# Patient Record
Sex: Male | Born: 1990 | Race: White | Hispanic: No | Smoking: Current every day smoker
Health system: Southern US, Community
[De-identification: ages and names within clinical notes are randomized; demographics above are authoritative.]

---
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
## 2012-01-03 ENCOUNTER — Emergency Department: Payer: Self-pay | Admitting: Emergency Medicine

## 2012-08-21 ENCOUNTER — Emergency Department: Payer: Self-pay | Admitting: Emergency Medicine

## 2014-11-10 ENCOUNTER — Emergency Department: Payer: Self-pay | Admitting: Emergency Medicine

## 2017-06-13 ENCOUNTER — Emergency Department
Admission: EM | Admit: 2017-06-13 | Discharge: 2017-06-13 | Disposition: A | Discharge status: Home / Self Care | Payer: BLUE CROSS/BLUE SHIELD | Attending: Emergency Medicine | Admitting: Emergency Medicine

## 2017-06-13 ENCOUNTER — Encounter: Payer: Self-pay | Admitting: Emergency Medicine

## 2017-06-13 DIAGNOSIS — R112 Nausea with vomiting, unspecified: Secondary | ICD-10-CM | POA: Diagnosis not present

## 2017-06-13 DIAGNOSIS — R42 Dizziness and giddiness: Secondary | ICD-10-CM | POA: Diagnosis present

## 2017-06-13 DIAGNOSIS — R1110 Vomiting, unspecified: Secondary | ICD-10-CM | POA: Diagnosis not present

## 2017-06-13 DIAGNOSIS — F17200 Nicotine dependence, unspecified, uncomplicated: Secondary | ICD-10-CM | POA: Diagnosis not present

## 2017-06-13 LAB — COMPREHENSIVE METABOLIC PANEL
ALBUMIN: 4.3 g/dL (ref 3.5–5.0)
ALK PHOS: 80 U/L (ref 38–126)
ALT: 36 U/L (ref 17–63)
ANION GAP: 7 (ref 5–15)
AST: 32 U/L (ref 15–41)
BUN: 14 mg/dL (ref 6–20)
CALCIUM: 9.3 mg/dL (ref 8.9–10.3)
CO2: 25 mmol/L (ref 22–32)
Chloride: 108 mmol/L (ref 101–111)
Creatinine, Ser: 0.9 mg/dL (ref 0.61–1.24)
GFR calc Af Amer: >60 mL/min (ref >60)
GFR calc non Af Amer: >60 mL/min (ref >60)
GLUCOSE: 112 mg/dL — AB (ref 65–99)
POTASSIUM: 3.7 mmol/L (ref 3.5–5.1)
SODIUM: 140 mmol/L (ref 135–145)
Total Bilirubin: 1 mg/dL (ref 0.3–1.2)
Total Protein: 7.3 g/dL (ref 6.5–8.1)

## 2017-06-13 LAB — CBC
HEMATOCRIT: 45.9 % (ref 40.0–52.0)
HEMOGLOBIN: 15.5 g/dL (ref 13.0–18.0)
MCH: 28.8 pg (ref 26.0–34.0)
MCHC: 33.8 g/dL (ref 32.0–36.0)
MCV: 85.1 fL (ref 80.0–100.0)
Platelets: 241 10*3/uL (ref 150–440)
RBC: 5.39 MIL/uL (ref 4.40–5.90)
RDW: 13.5 % (ref 11.5–14.5)
WBC: 9.8 10*3/uL (ref 3.8–10.6)

## 2017-06-13 LAB — URINALYSIS, COMPLETE (UACMP) WITH MICROSCOPIC
BACTERIA UA: NONE SEEN
Bilirubin Urine: NEGATIVE
Glucose, UA: NEGATIVE mg/dL
Hgb urine dipstick: NEGATIVE
Ketones, ur: 5 mg/dL — AB
Leukocytes, UA: NEGATIVE
Nitrite: NEGATIVE
PROTEIN: 30 mg/dL — AB
Specific Gravity, Urine: 1.04 — ABNORMAL HIGH (ref 1.005–1.030)
pH: 5 (ref 5.0–8.0)

## 2017-06-13 LAB — LIPASE, BLOOD: Lipase: 26 U/L (ref 11–51)

## 2017-06-13 MED ORDER — SODIUM CHLORIDE 0.9 % IV BOLUS (SEPSIS)
1000.0000 mL | Freq: Once | INTRAVENOUS | Status: AC
  Administered 2017-06-13: 1000 mL via INTRAVENOUS

## 2017-06-13 MED ORDER — ONDANSETRON HCL 4 MG/2ML IJ SOLN
4.0000 mg | Freq: Once | INTRAMUSCULAR | Status: AC
  Administered 2017-06-13: 4 mg via INTRAVENOUS
  Filled 2017-06-13: qty 2

## 2017-06-13 MED ORDER — ONDANSETRON HCL 4 MG PO TABS: 4.0000 mg | ORAL_TABLET | Freq: Three times a day (TID) | ORAL | 0 refills | Status: AC | PRN

## 2017-06-13 NOTE — ED Provider Notes (Signed)
[facility] Emergency Department Provider Note ____________________________________________   First MD Initiated Contact with Patient 06/13/17 1614     (approximate)  I have reviewed the triage vital signs and the nursing notes.   HISTORY  Chief Complaint Dizziness and Emesis    HPI Phillip J Orest Dygert. is a 26 y.o. male no signiificant past medical history who presents with nausea and vomiting associated with generalized weakness and malaise primarily yesterday into last night, and now mostly resolved. Patient states he still feels somewhat weak, and has crampy abdominal discomfort. Patient denies diarrhea or fever.  History reviewed. No pertinent past medical history.  There are no active problems to display for this patient.   History reviewed. No pertinent surgical history.  Prior to Admission medications   Medication Sig Start Date End Date Taking? Authorizing Provider  ondansetron (ZOFRAN) 4 MG tablet Take 1 tablet (4 mg total) by mouth every 8 (eight) hours as needed for nausea or vomiting. 06/13/17   Dionne Bucy, MD    Allergies Ivp dye [iodinated diagnostic agents] and Sulfa antibiotics  No family history on file.  Social History Social History  Substance Use Topics  . Smoking status: Current Every Day Smoker  . Smokeless tobacco: Never Used  . Alcohol use No    Review of Systems  Constitutional: No fever/chills Eyes: No visual changes. ENT: No sore throat. Cardiovascular: Denies chest pain. Respiratory: Denies shortness of breath. Gastrointestinal: Positive for nausea and vomiting Genitourinary: Negative for dysuria.  Musculoskeletal: Negative for back pain. Skin: Negative for rash. Neurological: Negative for headaches, focal weakness or numbness.   ____________________________________________   PHYSICAL EXAM:  VITAL SIGNS: ED Triage Vitals  Enc Vitals Group     BP 06/13/17 1357 121/70     Pulse Rate 06/13/17  1357 93     Resp 06/13/17 1357 18     Temp 06/13/17 1357 97.9 F (36.6 C)     Temp Source 06/13/17 1357 Oral     SpO2 06/13/17 1357 98 %     Weight 06/13/17 1353 242 lb (109.8 kg)     Height 06/13/17 1353 5\' 6"  (1.676 m)     Head Circumference --      Peak Flow --      Pain Score 06/13/17 1353 8     Pain Loc --      Pain Edu? --      Excl. in GC? --     Constitutional: Alert and oriented. Well appearing and in no acute distress. Eyes: Conjunctivae are normal.  Head: Atraumatic. Nose: No congestion/rhinnorhea. Mouth/Throat: Slightly dry mucous membranes Neck: Normal range of motion.  Cardiovascular: Good peripheral circulation. Respiratory: Normal respiratory effort.  No retractions. Gastrointestinal: Soft and nontender. No distention.  Genitourinary: No CVA tenderness. Musculoskeletal:   Extremities warm and well perfused.  Neurologic:  Normal speech and language. No gross focal neurologic deficits are appreciated.  Skin:  Skin is warm and dry. No rash noted. Psychiatric: Mood and affect are normal. Speech and behavior are normal.  ____________________________________________   LABS (all labs ordered are listed, but only abnormal results are displayed)  Labs Reviewed  COMPREHENSIVE METABOLIC PANEL - Abnormal; Notable for the following:       Result Value   Glucose, Bld 112 (*)    All other components within normal limits  URINALYSIS, COMPLETE (UACMP) WITH MICROSCOPIC - Abnormal; Notable for the following:    Color, Urine AMBER (*)    APPearance CLEAR (*)  Specific Gravity, Urine 1.040 (*)    Ketones, ur 5 (*)    Protein, ur 30 (*)    Squamous Epithelial / LPF 0-5 (*)    All other components within normal limits  LIPASE, BLOOD  CBC   ____________________________________________  EKG   ____________________________________________  RADIOLOGY    ____________________________________________   PROCEDURES  Procedure(s) performed: No    Critical Care  performed: No ____________________________________________   INITIAL IMPRESSION / ASSESSMENT AND PLAN / ED COURSE  Pertinent labs & imaging results that were available during my care of the patient were reviewed by me and considered in my medical decision making (see chart for details).  26 year old male no significant past medical history presents with one-day nausea and vomiting with abdominal discomfort mainly occurring last night and now mostly resolved. Patient states he still feels somewhat weak. Vital signs normal, patient is relatively well-appearing, and exam is unremarkable otherwise; patient has no abdominal tenderness. Overall suspect acute gastroenteritis versus foodborne illness. Plan for symptomatic treatment with fluids and Zofran and likely discharge home.    ----------------------------------------- 7:02 PM on 06/13/2017 -----------------------------------------  Patient feels better and wants to go home. Workup unremarkable.  ____________________________________________   FINAL CLINICAL IMPRESSION(S) / ED DIAGNOSES  Final diagnoses:  Non-intractable vomiting with nausea, unspecified vomiting type      NEW MEDICATIONS STARTED DURING THIS VISIT:  Discharge Medication List as of 06/13/2017  7:06 PM    START taking these medications   Details  ondansetron (ZOFRAN) 4 MG tablet Take 1 tablet (4 mg total) by mouth every 8 (eight) hours as needed for nausea or vomiting., Starting Sat 06/13/2017, Print         Note:  This document was prepared using Dragon voice recognition software and may include unintentional dictation errors.    Dionne Bucy, MD 06/13/17 2134

## 2017-06-13 NOTE — ED Triage Notes (Signed)
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

## 2019-12-22 ENCOUNTER — Emergency Department
Admission: EM | Admit: 2019-12-22 | Discharge: 2019-12-22 | Disposition: A | Discharge status: Home / Self Care | Payer: BLUE CROSS/BLUE SHIELD | Attending: Emergency Medicine | Admitting: Emergency Medicine

## 2019-12-22 ENCOUNTER — Other Ambulatory Visit: Payer: Self-pay

## 2019-12-22 ENCOUNTER — Encounter: Payer: Self-pay | Admitting: Emergency Medicine

## 2019-12-22 DIAGNOSIS — F17200 Nicotine dependence, unspecified, uncomplicated: Secondary | ICD-10-CM | POA: Insufficient documentation

## 2019-12-22 DIAGNOSIS — J069 Acute upper respiratory infection, unspecified: Secondary | ICD-10-CM

## 2019-12-22 DIAGNOSIS — Z20822 Contact with and (suspected) exposure to covid-19: Secondary | ICD-10-CM | POA: Insufficient documentation

## 2019-12-22 DIAGNOSIS — U071 COVID-19: Secondary | ICD-10-CM

## 2019-12-22 NOTE — ED Provider Notes (Signed)
[facility] Emergency Department Provider Note ____________________________________________  Time seen: 1015  I have reviewed the triage vital signs and the nursing notes.  HISTORY  Chief Complaint  Fever, Generalized Body Aches, and Fatigue  HPI Phillip Hunt. is a 29 y.o. male presents himself to the ED for evaluation of fevers, fatigue, and  body aches.  Patient reports he was exposed to Covid about 2 weeks prior at work.  Ports onset of symptoms yesterday.  He denies any change in taste and/or smell sensation.  He also denies any significant chest pain, shortness of breath, nausea, vomiting, or diarrhea.  Patient has been taking Tylenol for symptom relief.  He presents now for evaluation of his symptoms and testing.  History reviewed. No pertinent past medical history.  There are no problems to display for this patient.   History reviewed. No pertinent surgical history.  Prior to Admission medications   Medication Sig Start Date End Date Taking? Authorizing Provider  ondansetron (ZOFRAN) 4 MG tablet Take 1 tablet (4 mg total) by mouth every 8 (eight) hours as needed for nausea or vomiting. 06/13/17   Dionne Bucy, MD    Allergies Ivp dye [iodinated diagnostic agents] and Sulfa antibiotics  History reviewed. No pertinent family history.  Social History Social History   Tobacco Use  . Smoking status: Current Every Day Smoker  . Smokeless tobacco: Never Used  Substance Use Topics  . Alcohol use: No  . Drug use: No    Review of Systems  Constitutional: Positive for fever and fatigue. Eyes: Negative for visual changes. ENT: Negative for sore throat. Cardiovascular: Negative for chest pain. Respiratory: Negative for shortness of breath. Gastrointestinal: Negative for abdominal pain, vomiting and diarrhea. Genitourinary: Negative for dysuria. Musculoskeletal: Negative for back pain.  Reports generalized body aches Skin: Negative for  rash. Neurological: Negative for headaches, focal weakness or numbness. ____________________________________________  PHYSICAL EXAM:  VITAL SIGNS: ED Triage Vitals  Enc Vitals Group     BP 12/22/19 0955 122/64     Pulse Rate 12/22/19 0955 71     Resp --      Temp 12/22/19 0955 98.5 F (36.9 C)     Temp Source 12/22/19 0955 Oral     SpO2 12/22/19 0955 98 %     Weight 12/22/19 0952 280 lb (127 kg)     Height 12/22/19 0952 5\' 6"  (1.676 m)     Head Circumference --      Peak Flow --      Pain Score 12/22/19 0951 5     Pain Loc --      Pain Edu? --      Excl. in GC? --     Constitutional: Alert and oriented. Well appearing and in no distress. Head: Normocephalic and atraumatic. Eyes: Conjunctivae are normal. Normal extraocular movements Mouth/Throat: Mucous membranes are moist. Cardiovascular: Normal rate, regular rhythm. Normal distal pulses. Respiratory: Normal respiratory effort. No wheezes/rales/rhonchi. Musculoskeletal: Nontender with normal range of motion in all extremities.  Neurologic:  Normal gait without ataxia. Normal speech and language. No gross focal neurologic deficits are appreciated. Skin:  Skin is warm, dry and intact. No rash noted. Psychiatric: Mood and affect are normal. Patient exhibits appropriate insight and judgment. ____________________________________________   LABS (pertinent positives/negatives) Labs Reviewed  NOVEL CORONAVIRUS, NAA (HOSP ORDER, SEND-OUT TO REF LAB; TAT 18-24 HRS)  ____________________________________________  PROCEDURES  Procedures ____________________________________________  INITIAL IMPRESSION / ASSESSMENT AND PLAN / ED COURSE  Patient with ED evaluation  of symptoms concerning following a Covid exposure.  Patient's vital signs are stable and he has no signs of acute respiratory distress or systemic inflammatory response.  Patient will be tested for Covid at this time, and will quarantine at home while awaiting results.   Work note is provided holding him for the next week pending results.  He will continue to monitor and treat symptoms as necessary.  Return precautions have been discussed.  [name]. was evaluated in Emergency Department on 12/22/2019 for the symptoms described in the history of present illness. He was evaluated in the context of the global COVID-19 pandemic, which necessitated consideration that the patient might be at risk for infection with the SARS-CoV-2 virus that causes COVID-19. Institutional protocols and algorithms that pertain to the evaluation of patients at risk for COVID-19 are in a state of rapid change based on information released by regulatory bodies including the CDC and federal and state organizations. These policies and algorithms were followed during the patient's care in the ED. ____________________________________________  FINAL CLINICAL IMPRESSION(S) / ED DIAGNOSES  Final diagnoses:  Viral upper respiratory tract infection  Clinical diagnosis of COVID-19      Melvenia Needles, PA-C 12/22/19 1041    Harvest Dark, MD 12/22/19 1157

## 2019-12-22 NOTE — Discharge Instructions (Signed)
You are being tested for COVID. You will only be notified of positive results by phone. While awaiting test results, remain at home under quarantine. Continue to monitor and treat fevers with Tylenol and Motrin. Drink fluids to prevent dehydration and treat symptoms with OTC medicines. Follow-up with your provider or return to the ED for worsening symptoms. You can activate your [facility] MyChart to follow test results.

## 2019-12-22 NOTE — ED Triage Notes (Addendum)
States he developed some body aches  And fatigue last pm   Also had fever of 100.3 at home  Afebrile on arrival  States he was exposed to COVID at work about 2 weeks ago

## 2019-12-23 LAB — NOVEL CORONAVIRUS, NAA (HOSP ORDER, SEND-OUT TO REF LAB; TAT 18-24 HRS): SARS-CoV-2, NAA: NOT DETECTED

## 2020-01-07 ENCOUNTER — Encounter: Payer: Self-pay | Admitting: Emergency Medicine

## 2020-01-07 ENCOUNTER — Emergency Department: Payer: Worker's Compensation

## 2020-01-07 ENCOUNTER — Other Ambulatory Visit: Payer: Self-pay

## 2020-01-07 ENCOUNTER — Emergency Department
Admission: EM | Admit: 2020-01-07 | Discharge: 2020-01-07 | Disposition: A | Discharge status: Home / Self Care | Payer: Worker's Compensation | Attending: Emergency Medicine | Admitting: Emergency Medicine

## 2020-01-07 DIAGNOSIS — Y990 Civilian activity done for income or pay: Secondary | ICD-10-CM | POA: Diagnosis not present

## 2020-01-07 DIAGNOSIS — S39012A Strain of muscle, fascia and tendon of lower back, initial encounter: Secondary | ICD-10-CM | POA: Insufficient documentation

## 2020-01-07 DIAGNOSIS — S3992XA Unspecified injury of lower back, initial encounter: Secondary | ICD-10-CM | POA: Diagnosis present

## 2020-01-07 DIAGNOSIS — Y9389 Activity, other specified: Secondary | ICD-10-CM | POA: Diagnosis not present

## 2020-01-07 DIAGNOSIS — F17200 Nicotine dependence, unspecified, uncomplicated: Secondary | ICD-10-CM | POA: Insufficient documentation

## 2020-01-07 DIAGNOSIS — S7011XA Contusion of right thigh, initial encounter: Secondary | ICD-10-CM | POA: Diagnosis not present

## 2020-01-07 DIAGNOSIS — S7012XA Contusion of left thigh, initial encounter: Secondary | ICD-10-CM | POA: Diagnosis not present

## 2020-01-07 DIAGNOSIS — Y92410 Unspecified street and highway as the place of occurrence of the external cause: Secondary | ICD-10-CM | POA: Insufficient documentation

## 2020-01-07 DIAGNOSIS — S7010XA Contusion of unspecified thigh, initial encounter: Secondary | ICD-10-CM

## 2020-01-07 MED ORDER — LIDOCAINE 5 % EX PTCH: 1.0000 | MEDICATED_PATCH | CUTANEOUS | 0 refills | Status: AC

## 2020-01-07 MED ORDER — NAPROXEN 500 MG PO TABS: 500.0000 mg | ORAL_TABLET | Freq: Two times a day (BID) | ORAL | 0 refills | Status: AC

## 2020-01-07 MED ORDER — CYCLOBENZAPRINE HCL 5 MG PO TABS: 5.0000 mg | ORAL_TABLET | Freq: Three times a day (TID) | ORAL | 0 refills | Status: AC | PRN

## 2020-01-07 MED ORDER — NAPROXEN 500 MG PO TABS
500.0000 mg | ORAL_TABLET | Freq: Once | ORAL | Status: AC
  Administered 2020-01-07: 13:00:00 500 mg via ORAL
  Filled 2020-01-07: qty 1

## 2020-01-07 NOTE — ED Notes (Signed)
Pt reports dull pain in his lower back and both thighs. Denies numbness or tingling in legs. States unsure if he hit his head. Can move legs appropriately with appropriate color and warmth in legs. Denies CP and HA.
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
## 2020-01-07 NOTE — ED Notes (Signed)
Pt requesting food. Explained imaging must result first.
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
## 2021-05-20 ENCOUNTER — Emergency Department
Admission: EM | Admit: 2021-05-20 | Discharge: 2021-05-20 | Disposition: A | Discharge status: Home / Self Care | Payer: Self-pay | Attending: Emergency Medicine | Admitting: Emergency Medicine

## 2021-05-20 ENCOUNTER — Other Ambulatory Visit: Payer: Self-pay

## 2021-05-20 DIAGNOSIS — M5442 Lumbago with sciatica, left side: Secondary | ICD-10-CM

## 2021-05-20 DIAGNOSIS — X501XXA Overexertion from prolonged static or awkward postures, initial encounter: Secondary | ICD-10-CM | POA: Insufficient documentation

## 2021-05-20 DIAGNOSIS — M5441 Lumbago with sciatica, right side: Secondary | ICD-10-CM | POA: Insufficient documentation

## 2021-05-20 DIAGNOSIS — F17200 Nicotine dependence, unspecified, uncomplicated: Secondary | ICD-10-CM | POA: Insufficient documentation

## 2021-05-20 MED ORDER — TRAMADOL HCL 50 MG PO TABS: 50.0000 mg | ORAL_TABLET | Freq: Four times a day (QID) | ORAL | 0 refills | Status: AC | PRN

## 2021-05-20 MED ORDER — PREDNISONE 10 MG PO TABS: 10.0000 mg | ORAL_TABLET | Freq: Every day | ORAL | 0 refills | Status: AC

## 2021-05-20 NOTE — ED Triage Notes (Signed)
Pt here with lower back pain that started last night. Pt states that this morning he was having a hard time getting out of bed. Pt states that when he stands up his left leg is more weaker than his right leg.

## 2021-05-20 NOTE — ED Provider Notes (Signed)
[facility] Emergency Department Provider Note  Time seen: 1:16 PM  I have reviewed the triage vital signs and the nursing notes.   HISTORY  Chief Complaint Back Pain   HPI Phillip Hunt. is a 30 y.o. male with no significant past medical history presents emergency department for lower back pain.  According to the patient for the past 2 days now he has been experiencing lower back pain.  States that for started when he sat up quickly in bed felt a sharp pain to lower back and ever since has had pain with movement bending or twisting seems to go down the left leg at times.  Denies any bladder or bowel incontinence.  Denies any trouble ambulating.  Patient states a history of lower back pain in the past.   No past medical history on file.  There are no problems to display for this patient.   No past surgical history on file.  Prior to Admission medications   Medication Sig Start Date End Date Taking? Authorizing Provider  predniSONE (DELTASONE) 10 MG tablet Take 1 tablet (10 mg total) by mouth daily. Day 1-3: take 4 tablets PO daily Day 4-6: take 3 tablets PO daily Day 7-9: take 2 tablets PO daily Day 10-12: take 1 tablet PO daily 05/20/21  Yes Ewa Hipp, Caryn Bee, MD  traMADol (ULTRAM) 50 MG tablet Take 1 tablet (50 mg total) by mouth every 6 (six) hours as needed. 05/20/21  Yes Minna Antis, MD  cyclobenzaprine (FLEXERIL) 5 MG tablet Take 1 tablet (5 mg total) by mouth 3 (three) times daily as needed. 01/07/20   Menshew, Charlesetta Ivory, PA-C  ondansetron (ZOFRAN) 4 MG tablet Take 1 tablet (4 mg total) by mouth every 8 (eight) hours as needed for nausea or vomiting. 06/13/17   Dionne Bucy, MD    Allergies  Allergen Reactions   Ivp Dye [Iodinated Diagnostic Agents] Rash   Sulfa Antibiotics Rash    No family history on file.  Social History Social History   Tobacco Use   Smoking status: Every Day   Smokeless tobacco: Never  Substance Use  Topics   Alcohol use: No   Drug use: No    Review of Systems Constitutional: Negative for fever. Cardiovascular: Negative for chest pain. Respiratory: Negative for shortness of breath. Gastrointestinal: Negative for abdominal pain Genitourinary: Negative for incontinence. Musculoskeletal: Moderate lower back pain occasionally radiating down the left leg. Neurological: Negative for headache All other ROS negative  ____________________________________________   PHYSICAL EXAM:  VITAL SIGNS: ED Triage Vitals  Enc Vitals Group     BP 05/20/21 1108 116/77     Pulse Rate 05/20/21 1108 71     Resp 05/20/21 1108 18     Temp 05/20/21 1108 97.8 F (36.6 C)     Temp Source 05/20/21 1108 Oral     SpO2 05/20/21 1108 98 %     Weight 05/20/21 1109 (!) 305 lb (138.3 kg)     Height 05/20/21 1109 5\' 6"  (1.676 m)     Head Circumference --      Peak Flow --      Pain Score 05/20/21 1108 8     Pain Loc --      Pain Edu? --      Excl. in GC? --     Constitutional: Alert and oriented. Well appearing and in no distress. Eyes: Normal exam ENT      Head: Normocephalic and atraumatic.  Mouth/Throat: Mucous membranes are moist. Cardiovascular: Normal rate, regular rhythm Respiratory: Normal respiratory effort without tachypnea nor retractions. Breath sounds are clear  Gastrointestinal: Soft and nontender. No distention.  Musculoskeletal: Nontender with normal range of motion in all extremities.  States pain with range of motion of both lower extremities. Neurologic:  Normal speech and language. No gross focal neurologic deficits are appreciated.  5/5 strength in bilateral lower extremities.  No weakness on exam. Skin:  Skin is warm, dry and intact.  Psychiatric: Mood and affect are normal.   ____________________________________________    INITIAL IMPRESSION / ASSESSMENT AND PLAN / ED COURSE  Pertinent labs & imaging results that were available during my care of the patient were  reviewed by me and considered in my medical decision making (see chart for details).   Patient presents emergency department for low back pain with occasional radiation down the left leg.  Symptoms are very consistent with sciatica.  No incontinence, no numbness no weakness on exam.  Overall patient appears well.  We will discharge with short course of tramadol and placed on a prednisone taper.  Discussed PCP follow-up as well as return precautions.  Patient agreeable to plan of care.  Phillip Hunt. was evaluated in Emergency Department on 05/20/2021 for the symptoms described in the history of present illness. He was evaluated in the context of the global COVID-19 pandemic, which necessitated consideration that the patient might be at risk for infection with the SARS-CoV-2 virus that causes COVID-19. Institutional protocols and algorithms that pertain to the evaluation of patients at risk for COVID-19 are in a state of rapid change based on information released by regulatory bodies including the CDC and federal and state organizations. These policies and algorithms were followed during the patient's care in the ED.  ____________________________________________   FINAL CLINICAL IMPRESSION(S) / ED DIAGNOSES  Sciatica   Minna Antis, MD 05/20/21 1318
